# Patient Record
Sex: Male | Born: 1985 | Race: White | Hispanic: No | Marital: Married | Smoking: Current every day smoker
Health system: Southern US, Community
[De-identification: ages and names within clinical notes are randomized; demographics above are authoritative.]

## PROBLEM LIST (undated history)

## (undated) HISTORY — PX: OTHER SURGICAL HISTORY: SHX169

---

## 2009-09-14 ENCOUNTER — Emergency Department (HOSPITAL_COMMUNITY): Admission: EM | Admit: 2009-09-14 | Discharge: 2009-09-14 | Payer: Self-pay | Admitting: Emergency Medicine

## 2010-10-02 ENCOUNTER — Emergency Department (HOSPITAL_COMMUNITY): Admission: EM | Admit: 2010-10-02 | Discharge: 2010-10-02 | Payer: Self-pay | Admitting: Emergency Medicine

## 2011-03-30 ENCOUNTER — Emergency Department (HOSPITAL_COMMUNITY): Payer: Medicaid Other

## 2011-03-30 ENCOUNTER — Observation Stay (HOSPITAL_COMMUNITY)
Admission: EM | Admit: 2011-03-30 | Discharge: 2011-03-31 | DRG: 512 | Disposition: A | Discharge status: Home / Self Care | Payer: Medicaid Other | Attending: Orthopedic Surgery | Admitting: Orthopedic Surgery

## 2011-03-30 DIAGNOSIS — Z01810 Encounter for preprocedural cardiovascular examination: Secondary | ICD-10-CM | POA: Insufficient documentation

## 2011-03-30 DIAGNOSIS — S52309A Unspecified fracture of shaft of unspecified radius, initial encounter for closed fracture: Principal | ICD-10-CM | POA: Insufficient documentation

## 2011-03-30 DIAGNOSIS — Z01812 Encounter for preprocedural laboratory examination: Secondary | ICD-10-CM | POA: Insufficient documentation

## 2011-03-30 DIAGNOSIS — IMO0002 Reserved for concepts with insufficient information to code with codable children: Secondary | ICD-10-CM | POA: Insufficient documentation

## 2011-03-30 DIAGNOSIS — Z01818 Encounter for other preprocedural examination: Secondary | ICD-10-CM | POA: Insufficient documentation

## 2011-03-30 DIAGNOSIS — S63016A Dislocation of distal radioulnar joint of unspecified wrist, initial encounter: Secondary | ICD-10-CM | POA: Insufficient documentation

## 2011-03-30 DIAGNOSIS — Y998 Other external cause status: Secondary | ICD-10-CM | POA: Insufficient documentation

## 2011-03-30 DIAGNOSIS — S20219A Contusion of unspecified front wall of thorax, initial encounter: Secondary | ICD-10-CM | POA: Insufficient documentation

## 2011-03-30 LAB — CBC
HCT: 43 % (ref 39.0–52.0)
Hemoglobin: 15.2 g/dL (ref 13.0–17.0)
MCH: 31.3 pg (ref 26.0–34.0)
MCHC: 35.3 g/dL (ref 30.0–36.0)
RBC: 4.86 MIL/uL (ref 4.22–5.81)

## 2011-03-30 LAB — TYPE AND SCREEN: Antibody Screen: NEGATIVE

## 2011-03-30 LAB — DIFFERENTIAL
Basophils Relative: 0 % (ref 0–1)
Lymphocytes Relative: 24 % (ref 12–46)
Lymphs Abs: 2.1 10*3/uL (ref 0.7–4.0)
Monocytes Absolute: 1.1 10*3/uL — ABNORMAL HIGH (ref 0.1–1.0)
Monocytes Relative: 12 % (ref 3–12)
Neutro Abs: 5.2 10*3/uL (ref 1.7–7.7)
Neutrophils Relative %: 61 % (ref 43–77)

## 2011-03-30 LAB — POCT I-STAT, CHEM 8
BUN: 12 mg/dL (ref 6–23)
Chloride: 107 meq/L (ref 96–112)
Creatinine, Ser: 0.9 mg/dL (ref 0.4–1.5)
Potassium: 4.7 meq/L (ref 3.5–5.1)
Sodium: 139 meq/L (ref 135–145)

## 2011-03-30 LAB — COMPREHENSIVE METABOLIC PANEL
ALT: 11 U/L (ref 0–53)
Albumin: 4.6 g/dL (ref 3.5–5.2)
BUN: 11 mg/dL (ref 6–23)
Calcium: 9.5 mg/dL (ref 8.4–10.5)
Glucose, Bld: 89 mg/dL (ref 70–99)
Sodium: 137 mEq/L (ref 135–145)
Total Protein: 8.2 g/dL (ref 6.0–8.3)

## 2011-03-30 LAB — GLUCOSE, CAPILLARY

## 2011-03-30 LAB — URINALYSIS, ROUTINE W REFLEX MICROSCOPIC
Leukocytes, UA: NEGATIVE
Protein, ur: NEGATIVE mg/dL
Specific Gravity, Urine: <1.005 — ABNORMAL LOW (ref 1.005–1.030)
Urobilinogen, UA: 0.2 mg/dL (ref 0.0–1.0)

## 2011-03-30 LAB — URINE MICROSCOPIC-ADD ON

## 2011-03-30 LAB — LACTIC ACID, PLASMA: Lactic Acid, Venous: 1.2 mmol/L (ref 0.5–2.2)

## 2011-03-30 LAB — PROTIME-INR: Prothrombin Time: 13.8 seconds (ref 11.6–15.2)

## 2011-03-30 LAB — LIPASE, BLOOD: Lipase: 36 U/L (ref 11–59)

## 2011-03-30 LAB — ETHANOL: Alcohol, Ethyl (B): <11 mg/dL — ABNORMAL HIGH (ref 0–10)

## 2011-03-30 MED ORDER — IOHEXOL 300 MG/ML  SOLN
100.0000 mL | Freq: Once | INTRAMUSCULAR | Status: AC | PRN
  Administered 2011-03-30: 100 mL via INTRAVENOUS

## 2011-03-31 LAB — RAPID URINE DRUG SCREEN, HOSP PERFORMED
Amphetamines: NOT DETECTED
Benzodiazepines: NOT DETECTED
Tetrahydrocannabinol: POSITIVE — AB

## 2011-04-02 LAB — URINE CULTURE

## 2011-04-05 ENCOUNTER — Emergency Department (HOSPITAL_COMMUNITY)
Admission: EM | Admit: 2011-04-05 | Discharge: 2011-04-05 | Disposition: A | Discharge status: Home / Self Care | Payer: Medicaid Other | Attending: Emergency Medicine | Admitting: Emergency Medicine

## 2011-04-05 DIAGNOSIS — G8918 Other acute postprocedural pain: Secondary | ICD-10-CM | POA: Insufficient documentation
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
## 2011-12-26 ENCOUNTER — Emergency Department (HOSPITAL_COMMUNITY)
Admission: EM | Admit: 2011-12-26 | Discharge: 2011-12-26 | Disposition: A | Discharge status: Home / Self Care | Payer: Self-pay | Attending: Emergency Medicine | Admitting: Emergency Medicine

## 2011-12-26 ENCOUNTER — Encounter (HOSPITAL_COMMUNITY): Payer: Self-pay

## 2011-12-26 DIAGNOSIS — IMO0002 Reserved for concepts with insufficient information to code with codable children: Secondary | ICD-10-CM | POA: Insufficient documentation

## 2011-12-26 DIAGNOSIS — F17200 Nicotine dependence, unspecified, uncomplicated: Secondary | ICD-10-CM | POA: Insufficient documentation

## 2011-12-26 DIAGNOSIS — L02419 Cutaneous abscess of limb, unspecified: Secondary | ICD-10-CM

## 2011-12-26 MED ORDER — SULFAMETHOXAZOLE-TRIMETHOPRIM 800-160 MG PO TABS: 1.0000 | ORAL_TABLET | Freq: Two times a day (BID) | ORAL | Status: AC

## 2011-12-26 MED ORDER — HYDROCODONE-ACETAMINOPHEN 5-325 MG PO TABS: 1.0000 | ORAL_TABLET | ORAL | Status: AC | PRN

## 2011-12-26 MED ORDER — LIDOCAINE HCL (PF) 1 % IJ SOLN
INTRAMUSCULAR | Status: AC
  Administered 2011-12-26: 23:00:00
  Filled 2011-12-26: qty 5

## 2011-12-26 MED ORDER — SULFAMETHOXAZOLE-TMP DS 800-160 MG PO TABS
1.0000 | ORAL_TABLET | Freq: Once | ORAL | Status: AC
  Administered 2011-12-26: 1 via ORAL
  Filled 2011-12-26: qty 1

## 2011-12-26 MED ORDER — OXYCODONE-ACETAMINOPHEN 5-325 MG PO TABS
1.0000 | ORAL_TABLET | Freq: Once | ORAL | Status: AC
  Administered 2011-12-26: 1 via ORAL
  Filled 2011-12-26: qty 1

## 2011-12-26 MED ORDER — BACITRACIN ZINC 500 UNIT/GM EX OINT
TOPICAL_OINTMENT | CUTANEOUS | Status: AC
  Administered 2011-12-26: 23:00:00
  Filled 2011-12-26: qty 0.9

## 2011-12-26 NOTE — ED Notes (Signed)
Pt presents with abscess under left arm x3 days. °

## 2011-12-26 NOTE — ED Provider Notes (Signed)
History     CSN: [id number]  Arrival date & time 12/26/11  2206   First MD Initiated Contact with Patient 12/26/11 2216      Chief Complaint  Patient presents with  . Abscess    (Consider location/radiation/quality/duration/timing/severity/associated sxs/prior treatment) Patient is a 26 y.o. male presenting with abscess. The history is provided by the patient. No language interpreter was used.  Abscess  This is a new problem. The current episode started less than one week ago. The onset was gradual. The problem occurs occasionally. The problem has been gradually worsening. The abscess is present on the left arm. The problem is moderate. The abscess is characterized by redness and painfulness. The abscess first occurred at home. Pertinent negatives include no fever. There were no sick contacts. He has received no recent medical care.    History reviewed. No pertinent past medical history.  Past Surgical History  Procedure Date  . Arm surgery     No family history on file.  History  Substance Use Topics  . Smoking status: Current Everyday Smoker -- 1.0 packs/day  . Smokeless tobacco: Not on file  . Alcohol Use: No      Review of Systems  Constitutional: Negative for fever.  All other systems reviewed and are negative.    Allergies  Review of patient's allergies indicates no known allergies.  Home Medications  No current outpatient prescriptions on file.  BP 114/90  Pulse 66  Temp(Src) 97.4 F (36.3 C) (Oral)  Resp 18  Ht 5\' 8"  (1.727 m)  Wt 190 lb (86.183 kg)  BMI 28.89 kg/m2  SpO2 100%  Physical Exam  Constitutional: He is oriented to person, place, and time. He appears well-developed and well-nourished.  HENT:  Head: Normocephalic and atraumatic.  Eyes: Conjunctivae are normal. Pupils are equal, round, and reactive to light.  Neck: Normal range of motion. Neck supple.  Cardiovascular: Normal rate, regular rhythm, normal heart sounds and intact distal  pulses.   Pulmonary/Chest: Effort normal and breath sounds normal.  Abdominal: Soft. Bowel sounds are normal.  Musculoskeletal: Normal range of motion.  Neurological: He is alert and oriented to person, place, and time.  Psychiatric: He has a normal mood and affect.  Area of induration left axilla  ED Course  Procedures (including critical care time) INCISION AND DRAINAGE Performed by: Jimmye Norman Consent: Verbal consent obtained. Risks and benefits: risks, benefits and alternatives were discussed Type: abscess  Body area: left axilla Anesthesia: local infiltration  Local anesthetic: lidocaine 1%  Anesthetic total: 4 ml  Drainage: none  Patient tolerance: Patient tolerated the procedure well with no immediate complications.   Labs Reviewed - No data to display No results found.   No diagnosis found.   L axillary abscess MDM          Jimmye Norman, NP 12/26/11 2253

## 2011-12-27 NOTE — ED Provider Notes (Signed)
Medical screening examination/treatment/procedure(s) were performed by non-physician practitioner and as supervising physician I was immediately available for consultation/collaboration.  Nicoletta Dress. Colon Branch, MD 12/27/11 1545
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
## 2012-07-30 ENCOUNTER — Encounter (HOSPITAL_COMMUNITY): Payer: Self-pay | Admitting: Emergency Medicine

## 2012-07-30 ENCOUNTER — Emergency Department (HOSPITAL_COMMUNITY)
Admission: EM | Admit: 2012-07-30 | Discharge: 2012-07-30 | Disposition: A | Discharge status: Home / Self Care | Payer: Self-pay | Attending: Emergency Medicine | Admitting: Emergency Medicine

## 2012-07-30 DIAGNOSIS — IMO0002 Reserved for concepts with insufficient information to code with codable children: Secondary | ICD-10-CM | POA: Insufficient documentation

## 2012-07-30 DIAGNOSIS — Y93I9 Activity, other involving external motion: Secondary | ICD-10-CM | POA: Insufficient documentation

## 2012-07-30 DIAGNOSIS — Y998 Other external cause status: Secondary | ICD-10-CM | POA: Insufficient documentation

## 2012-07-30 DIAGNOSIS — F17200 Nicotine dependence, unspecified, uncomplicated: Secondary | ICD-10-CM | POA: Insufficient documentation

## 2012-07-30 DIAGNOSIS — S20419A Abrasion of unspecified back wall of thorax, initial encounter: Secondary | ICD-10-CM

## 2012-07-30 DIAGNOSIS — T07XXXA Unspecified multiple injuries, initial encounter: Secondary | ICD-10-CM | POA: Insufficient documentation

## 2012-07-30 MED ORDER — IBUPROFEN 400 MG PO TABS
400.0000 mg | ORAL_TABLET | Freq: Once | ORAL | Status: AC
  Administered 2012-07-30: 400 mg via ORAL
  Filled 2012-07-30: qty 1

## 2012-07-30 MED ORDER — OXYCODONE-ACETAMINOPHEN 5-325 MG PO TABS
2.0000 | ORAL_TABLET | Freq: Once | ORAL | Status: DC
Start: 2012-07-30 — End: 2012-07-30

## 2012-07-30 MED ORDER — CYCLOBENZAPRINE HCL 10 MG PO TABS: 10.0000 mg | ORAL_TABLET | Freq: Three times a day (TID) | ORAL | Status: AC | PRN

## 2012-07-30 MED ORDER — NAPROXEN 500 MG PO TABS: 500.0000 mg | ORAL_TABLET | Freq: Two times a day (BID) | ORAL | Status: AC | PRN
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
## 2012-07-30 NOTE — ED Notes (Signed)
Patient here with wife who is being seen as patient as well, Dr. Juleen China notified.

## 2012-07-30 NOTE — ED Notes (Signed)
Pt discharged. Pt stable at time of discharge. Medications reviewed pt has no questions regarding discharge at this time. Pt voiced understanding of discharge instructions.  

## 2014-05-22 ENCOUNTER — Encounter (HOSPITAL_COMMUNITY): Payer: Self-pay | Admitting: Emergency Medicine

## 2014-05-22 ENCOUNTER — Emergency Department (HOSPITAL_COMMUNITY)
Admission: EM | Admit: 2014-05-22 | Discharge: 2014-05-22 | Disposition: A | Discharge status: Home / Self Care | Payer: Worker's Compensation | Attending: Emergency Medicine | Admitting: Emergency Medicine

## 2014-05-22 ENCOUNTER — Emergency Department (HOSPITAL_COMMUNITY): Payer: Worker's Compensation

## 2014-05-22 DIAGNOSIS — Z888 Allergy status to other drugs, medicaments and biological substances status: Secondary | ICD-10-CM | POA: Insufficient documentation

## 2014-05-22 DIAGNOSIS — S42402A Unspecified fracture of lower end of left humerus, initial encounter for closed fracture: Secondary | ICD-10-CM

## 2014-05-22 DIAGNOSIS — Y939 Activity, unspecified: Secondary | ICD-10-CM | POA: Insufficient documentation

## 2014-05-22 DIAGNOSIS — Z885 Allergy status to narcotic agent status: Secondary | ICD-10-CM | POA: Insufficient documentation

## 2014-05-22 DIAGNOSIS — W11XXXA Fall on and from ladder, initial encounter: Secondary | ICD-10-CM | POA: Insufficient documentation

## 2014-05-22 DIAGNOSIS — Y929 Unspecified place or not applicable: Secondary | ICD-10-CM | POA: Insufficient documentation

## 2014-05-22 DIAGNOSIS — S42409A Unspecified fracture of lower end of unspecified humerus, initial encounter for closed fracture: Secondary | ICD-10-CM | POA: Insufficient documentation

## 2014-05-22 MED ORDER — OXYCODONE-ACETAMINOPHEN 5-325 MG PO TABS: 1.0000 | ORAL_TABLET | ORAL | Status: AC | PRN

## 2014-05-22 MED ORDER — HYDROCODONE-ACETAMINOPHEN 5-325 MG PO TABS
1.0000 | ORAL_TABLET | Freq: Once | ORAL | Status: AC
  Administered 2014-05-22: 1 via ORAL
  Filled 2014-05-22: qty 1

## 2014-05-22 MED ORDER — OXYCODONE-ACETAMINOPHEN 5-325 MG PO TABS
1.0000 | ORAL_TABLET | Freq: Once | ORAL | Status: AC
  Administered 2014-05-22: 1 via ORAL
  Filled 2014-05-22: qty 1

## 2014-05-22 NOTE — ED Notes (Signed)
Jay SeatFell off a ladder and injured my left arm per pt.

## 2014-05-22 NOTE — Discharge Instructions (Signed)

## 2014-05-22 NOTE — ED Notes (Signed)
3in splint applied to left arm and sling applied. Pt sts understanding of splint care and follow up care with ortho care.
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
## 2014-05-22 NOTE — ED Provider Notes (Signed)
CSN: [id number]     Arrival date & time 05/22/14  1946 History   First MD Initiated Contact with Patient 05/22/14 2007     Chief Complaint  Patient presents with  . Arm Injury     (Consider location/radiation/quality/duration/timing/severity/associated sxs/prior Treatment) The history is provided by the patient.   Jay Moses is a [age] y.o. male presenting with pain in his left elbow and upper arm after falling off a ladder 1 hour before arrival.  He was on the 6th rung when he fell, landing on his outstretched left hand only gravel.  He reports constant pain in the left elbow with sharp pain in his bicep muscle which is worsened with movement.  He has had no medicines prior to arrival here.  He denies weakness or numbness distal to the elbow and denies other pain or injury including no neck or back pain.  He did not hit his head.      History reviewed. No pertinent past medical history. Past Surgical History  Procedure Laterality Date  . Arm surgery     No family history on file. History  Substance Use Topics  . Smoking status: Current Every Day Smoker -- 1.00 packs/day  . Smokeless tobacco: Not on file  . Alcohol Use: Yes    Review of Systems  Constitutional: Negative for fever.  Musculoskeletal: Positive for arthralgias. Negative for joint swelling and myalgias.  Neurological: Negative for weakness and numbness.      Allergies  Ibuprofen and Tylenol with codeine #3  Home Medications   Prior to Admission medications   Medication Sig Start Date End Date Taking? Authorizing Provider  oxyCODONE-acetaminophen (PERCOCET/ROXICET) 5-325 MG per tablet Take 1 tablet by mouth every 4 (four) hours as needed. 05/22/14   Burgess AmorJulie Elvira Langston, PA-C   BP 141/83  Pulse 71  Temp(Src) 98 F (36.7 C) (Oral)  Resp 24  Ht 5\' 8"  (1.727 m)  Wt 165 lb (74.844 kg)  BMI 25.09 kg/m2  SpO2 100% Physical Exam  Constitutional: He appears well-developed and well-nourished.  HENT:  Head:  Atraumatic.  Neck: Normal range of motion.  Cardiovascular:  Pulses:      Radial pulses are 2+ on the right side, and 2+ on the left side.  Pulses equal bilaterally  Musculoskeletal: He exhibits tenderness.       Left elbow: He exhibits no swelling and no deformity. Tenderness found. Medial epicondyle and lateral epicondyle tenderness noted.  TTP along medial and lateral epicondyles.  No palpable deformity.  Tender along lower bicep, no deformity.  No pain in forearm, wrist or hand.  Nontender left shoulder and clavicle.  Neurological: He is alert. He has normal strength. He displays normal reflexes. No sensory deficit.  Skin: Skin is warm and dry.  Psychiatric: He has a normal mood and affect.    ED Course  Procedures (including critical care time) Labs Review Labs Reviewed - No data to display  Imaging Review Dg Elbow Complete Left  05/22/2014   CLINICAL DATA:  Fall from ladder, left elbow pain  EXAM: LEFT ELBOW - COMPLETE 3+ VIEW  COMPARISON:  None.  FINDINGS: Suboptimal positioning due to patient's inability to fully flex the elbow.  No fracture is seen. Specifically, the radial head is well visualized and appears intact.  However, a posterior elbow joint fat pad may be present. The anterior elbow joint fat pad appears normal.  Although equivocal, a possible elbow joint effusion raises some degree of concern for an occult/nonvisualized fracture.  IMPRESSION: No fracture is seen.  However, in the setting of a possible elbow joint effusion, an occult injury is not entirely excluded.  Consider repeat elbow joint radiographs in 10 days if clinically warranted.   Electronically Signed   By: Charline BillsSriyesh  Krishnan M.D.   On: 05/22/2014 21:00   Dg Humerus Left  05/22/2014   CLINICAL DATA:  Fall, left humerus/elbow pain  EXAM: LEFT HUMERUS - 2+ VIEW  COMPARISON:  None.  FINDINGS: No fracture or dislocation is seen.  The joint spaces are preserved.  The visualized soft tissues are unremarkable.   Visualized left lung is clear.  IMPRESSION: No fracture or dislocation is seen.   Electronically Signed   By: Charline BillsSriyesh  Krishnan M.D.   On: 05/22/2014 20:55     EKG Interpretation None      MDM   Final diagnoses:  Occult fracture of elbow, left, closed, initial encounter    Patients labs and/or radiological studies were viewed and considered during the medical decision making and disposition process.  Suspect there is an occult fracture given xray findings and patient location of pain.  He does not have bicep deformity, doubt bicep tear. Pt was placed in sugar tong splint, sling provided.  He was prescribed oxycodone and referred to Dr Romeo AppleHarrison for  Further tx - pt to call for appt.    Splint was examined post application, pain improved,  Patient can wiggle digits, less than 3 sec cap refill.      Burgess AmorJulie Laurella Tull, PA-C 05/23/14 0130

## 2014-05-23 ENCOUNTER — Telehealth: Payer: Self-pay | Admitting: *Deleted
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
## 2014-05-23 NOTE — Telephone Encounter (Signed)
One week

## 2014-05-25 NOTE — ED Provider Notes (Signed)
Medical screening examination/treatment/procedure(s) were performed by non-physician practitioner and as supervising physician I was immediately available for consultation/collaboration.   EKG Interpretation None        Layla MawKristen N Ceclia Koker, DO 05/25/14 [phone]

## 2014-05-27 NOTE — Telephone Encounter (Signed)
I have contacted patient today, 05/27/14, our first clinic day since holiday.  Patient relays that this elbow injury is work-related/workers comp.  I reviewed what is required in terms of our in-house form - he will have employer contact our office.  I also relayed that the employer may have other procedures they may need to follow, and that we will need to verify if we are the panel provider that the workers comp insurer may need to recommend, patient's [phone]#[phone].

## 2014-05-27 NOTE — Telephone Encounter (Signed)
Routing to Carol Foltz to schedule 
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
## 2014-06-06 NOTE — Telephone Encounter (Signed)
As of today, 06/06/14 - Workers Comp forms have not been picked up for completion as discussed; therefore, appointment has been scheduled.  Called patient to follow up - his voice mailbox is full, no message can be left.
